# Patient Record
Sex: Female | Born: 1970 | Race: White | Hispanic: Yes | Marital: Single
Health system: Southern US, Community
[De-identification: ages and names within clinical notes are randomized; demographics above are authoritative.]

## PROBLEM LIST (undated history)

## (undated) DIAGNOSIS — IMO0002 Reserved for concepts with insufficient information to code with codable children: Secondary | ICD-10-CM

## (undated) DIAGNOSIS — K219 Gastro-esophageal reflux disease without esophagitis: Secondary | ICD-10-CM

## (undated) DIAGNOSIS — IMO0001 Reserved for inherently not codable concepts without codable children: Secondary | ICD-10-CM

## (undated) DIAGNOSIS — F32A Depression, unspecified: Secondary | ICD-10-CM

## (undated) DIAGNOSIS — R011 Cardiac murmur, unspecified: Secondary | ICD-10-CM

## (undated) DIAGNOSIS — F329 Major depressive disorder, single episode, unspecified: Secondary | ICD-10-CM

## (undated) HISTORY — PX: CERVICAL SPINE SURGERY: SHX589

## (undated) HISTORY — PX: TUBAL LIGATION: SHX77

## (undated) HISTORY — PX: ABDOMINAL HYSTERECTOMY: SHX81

## (undated) HISTORY — PX: ECTOPIC PREGNANCY SURGERY: SHX613

## (undated) HISTORY — PX: ANKLE SURGERY: SHX546

---

## 2004-12-22 ENCOUNTER — Emergency Department (HOSPITAL_COMMUNITY): Admission: EM | Admit: 2004-12-22 | Discharge: 2004-12-23 | Payer: Self-pay | Admitting: Emergency Medicine

## 2008-10-22 ENCOUNTER — Emergency Department (HOSPITAL_COMMUNITY): Admission: EM | Admit: 2008-10-22 | Discharge: 2008-10-22 | Payer: Self-pay | Admitting: Emergency Medicine

## 2011-09-27 ENCOUNTER — Encounter (HOSPITAL_BASED_OUTPATIENT_CLINIC_OR_DEPARTMENT_OTHER): Payer: Self-pay | Admitting: *Deleted

## 2011-09-27 ENCOUNTER — Emergency Department (HOSPITAL_BASED_OUTPATIENT_CLINIC_OR_DEPARTMENT_OTHER)
Admission: EM | Admit: 2011-09-27 | Discharge: 2011-09-27 | Disposition: A | Discharge status: Home / Self Care | Payer: Medicare Other | Attending: Emergency Medicine | Admitting: Emergency Medicine

## 2011-09-27 ENCOUNTER — Emergency Department (HOSPITAL_BASED_OUTPATIENT_CLINIC_OR_DEPARTMENT_OTHER): Payer: Medicare Other

## 2011-09-27 DIAGNOSIS — M25519 Pain in unspecified shoulder: Secondary | ICD-10-CM | POA: Insufficient documentation

## 2011-09-27 DIAGNOSIS — M549 Dorsalgia, unspecified: Secondary | ICD-10-CM | POA: Insufficient documentation

## 2011-09-27 DIAGNOSIS — F17200 Nicotine dependence, unspecified, uncomplicated: Secondary | ICD-10-CM | POA: Insufficient documentation

## 2011-09-27 HISTORY — DX: Reserved for inherently not codable concepts without codable children: IMO0001

## 2011-09-27 HISTORY — DX: Depression, unspecified: F32.A

## 2011-09-27 HISTORY — DX: Major depressive disorder, single episode, unspecified: F32.9

## 2011-09-27 HISTORY — DX: Gastro-esophageal reflux disease without esophagitis: K21.9

## 2011-09-27 HISTORY — DX: Reserved for concepts with insufficient information to code with codable children: IMO0002

## 2011-09-27 HISTORY — DX: Cardiac murmur, unspecified: R01.1

## 2011-09-27 MED ORDER — DICLOFENAC SODIUM 75 MG PO TBEC: 75.0000 mg | DELAYED_RELEASE_TABLET | Freq: Two times a day (BID) | ORAL | Status: AC

## 2011-09-27 MED ORDER — HYDROCODONE-ACETAMINOPHEN 5-325 MG PO TABS: 2.0000 | ORAL_TABLET | ORAL | Status: AC | PRN

## 2011-09-27 MED ORDER — HYDROCODONE-ACETAMINOPHEN 5-325 MG PO TABS
2.0000 | ORAL_TABLET | Freq: Once | ORAL | Status: AC
  Administered 2011-09-27: 2 via ORAL
  Filled 2011-09-27: qty 2

## 2011-09-27 MED ORDER — METHOCARBAMOL 500 MG PO TABS: 500.0000 mg | ORAL_TABLET | Freq: Two times a day (BID) | ORAL | Status: AC

## 2011-09-27 MED ORDER — KETOROLAC TROMETHAMINE 60 MG/2ML IM SOLN
60.0000 mg | Freq: Once | INTRAMUSCULAR | Status: AC
  Administered 2011-09-27: 60 mg via INTRAMUSCULAR
  Filled 2011-09-27: qty 2

## 2011-09-27 NOTE — ED Provider Notes (Signed)
Medical screening examination/treatment/procedure(s) were performed by non-physician practitioner and as supervising physician I was immediately available for consultation/collaboration.    Jenese Mischke L Corrigan Kretschmer, MD 09/27/11 1838 

## 2011-09-27 NOTE — Discharge Instructions (Signed)
Back Pain, Adult  Low back pain is very common. About 1 in 5 people have back pain. The cause of low back pain is rarely dangerous. The pain often gets better over time. About half of people with a sudden onset of back pain feel better in just 2 weeks. About 8 in 10 people feel better by 6 weeks.    CAUSES  Some common causes of back pain include:   Strain of the muscles or ligaments supporting the spine.    Wear and tear (degeneration) of the spinal discs.    Arthritis.    Direct injury to the back.   DIAGNOSIS  Most of the time, the direct cause of low back pain is not known. However, back pain can be treated effectively even when the exact cause of the pain is unknown. Answering your caregiver's questions about your overall health and symptoms is one of the most accurate ways to make sure the cause of your pain is not dangerous. If your caregiver needs more information, he or she may order lab work or imaging tests (X-rays or MRIs). However, even if imaging tests show changes in your back, this usually does not require surgery.  HOME CARE INSTRUCTIONS  For many people, back pain returns. Since low back pain is rarely dangerous, it is often a condition that people can learn to manage on their own.     Remain active. It is stressful on the back to sit or stand in one place. Do not sit, drive, or stand in one place for more than 30 minutes at a time. Take short walks on level surfaces as soon as pain allows. Try to increase the length of time you walk each day.    Do not stay in bed. Resting more than 1 or 2 days can delay your recovery.    Do not avoid exercise or work. Your body is made to move. It is not dangerous to be active, even though your back may hurt. Your back will likely heal faster if you return to being active before your pain is gone.     Pay attention to your body when you  bend and lift. Many people have less discomfort when lifting if they bend their knees, keep the load close to their bodies, and avoid twisting. Often, the most comfortable positions are those that put less stress on your recovering back.    Find a comfortable position to sleep. Use a firm mattress and lie on your side with your knees slightly bent. If you lie on your back, put a pillow under your knees.    Only take over-the-counter or prescription medicines as directed by your caregiver. Over-the-counter medicines to reduce pain and inflammation are often the most helpful. Your caregiver may prescribe muscle relaxant drugs. These medicines help dull your pain so you can more quickly return to your normal activities and healthy exercise.    Put ice on the injured area.    Put ice in a plastic bag.    Place a towel between your skin and the bag.    Leave the ice on for 15 to 20 minutes, 3 to 4 times a day for the first 2 to 3 days. After that, ice and heat may be alternated to reduce pain and spasms.    Ask your caregiver about trying back exercises and gentle massage. This may be of some benefit.    Avoid feeling anxious or stressed. Stress increases muscle tension and can worsen   back pain. It is important to recognize when you are anxious or stressed and learn ways to manage it. Exercise is a great option.   SEEK MEDICAL CARE IF:   You have pain that is not relieved with rest or medicine.    You have pain that does not improve in 1 week.    You have new symptoms.    You are generally not feeling well.   SEEK IMMEDIATE MEDICAL CARE IF:     You have pain that radiates from your back into your legs.    You develop new bowel or bladder control problems.    You have unusual weakness or numbness in your arms or legs.    You develop nausea or vomiting.    You develop abdominal pain.    You feel faint.    Document Released: 04/19/2005 Document Revised: 04/08/2011 Document Reviewed: 09/07/2010  [employer] Patient Information 2012 [employer]    Back Pain, Adult  Low back pain is very common. About 1 in 5 people have back pain. The cause of low back pain is rarely dangerous. The pain often gets better over time. About half of people with a sudden onset of back pain feel better in just 2 weeks. About 8 in 10 people feel better by 6 weeks.    CAUSES  Some common causes of back pain include:   Strain of the muscles or ligaments supporting the spine.    Wear and tear (degeneration) of the spinal discs.    Arthritis.    Direct injury to the back.   DIAGNOSIS  Most of the time, the direct cause of low back pain is not known. However, back pain can be treated effectively even when the exact cause of the pain is unknown. Answering your caregiver's questions about your overall health and symptoms is one of the most accurate ways to make sure the cause of your pain is not dangerous. If your caregiver needs more information, he or she may order lab work or imaging tests (X-rays or MRIs). However, even if imaging tests show changes in your back, this usually does not require surgery.  HOME CARE INSTRUCTIONS  For many people, back pain returns. Since low back pain is rarely dangerous, it is often a condition that people can learn to manage on their own.     Remain active. It is stressful on the back to sit or stand in one place. Do not sit, drive, or stand in one place for more than 30 minutes at a time. Take short walks on level surfaces as soon as pain allows. Try to increase the length of time you walk each day.    Do not stay in bed. Resting more than 1 or 2 days can delay your recovery.    Do not avoid exercise or work. Your body is made to move. It is not dangerous to be active, even though your back may hurt. Your back will likely heal faster if you return to being active before your pain is gone.     Pay attention to your body when you  bend and lift. Many people have less discomfort when lifting if they bend their knees, keep the load close to their bodies, and avoid twisting. Often, the most comfortable positions are those that put less stress on your recovering back.    Find a comfortable position to sleep. Use a firm mattress and lie on your side with your knees slightly bent. If you lie on   your back, put a pillow under your knees.    Only take over-the-counter or prescription medicines as directed by your caregiver. Over-the-counter medicines to reduce pain and inflammation are often the most helpful. Your caregiver may prescribe muscle relaxant drugs. These medicines help dull your pain so you can more quickly return to your normal activities and healthy exercise.    Put ice on the injured area.    Put ice in a plastic bag.    Place a towel between your skin and the bag.    Leave the ice on for 15 to 20 minutes, 3 to 4 times a day for the first 2 to 3 days. After that, ice and heat may be alternated to reduce pain and spasms.    Ask your caregiver about trying back exercises and gentle massage. This may be of some benefit.    Avoid feeling anxious or stressed. Stress increases muscle tension and can worsen back pain. It is important to recognize when you are anxious or stressed and learn ways to manage it. Exercise is a great option.   SEEK MEDICAL CARE IF:   You have pain that is not relieved with rest or medicine.    You have pain that does not improve in 1 week.    You have new symptoms.    You are generally not feeling well.   SEEK IMMEDIATE MEDICAL CARE IF:     You have pain that radiates from your back into your legs.    You develop new bowel or bladder control problems.    You have unusual weakness or numbness in your arms or legs.    You develop nausea or vomiting.    You develop abdominal pain.    You feel faint.    Document Released: 04/19/2005 Document Revised: 04/08/2011 Document Reviewed: 09/07/2010  [employer] Patient Information 2012 [employer].

## 2011-09-27 NOTE — ED Provider Notes (Signed)
History     CSN: [id number]  Arrival date & time 09/27/11  1258   First MD Initiated Contact with Patient 09/27/11 1457      Chief Complaint  Patient presents with  . Shoulder Pain    posterior shoulder blade    (Consider location/radiation/quality/duration/timing/severity/associated sxs/prior treatment) Patient is a 41 y.o. female presenting with back pain. The history is provided by the patient. No language interpreter was used.  Back Pain  This is a new problem. The problem occurs constantly. The problem has been gradually worsening. The pain is associated with no known injury. The pain is present in the thoracic spine. The quality of the pain is described as stabbing. The symptoms are aggravated by certain positions. The pain is worse during the night. She has tried NSAIDs and analgesics for the symptoms. The treatment provided no relief. Risk factors: hx of neck problems.    Past Medical History  Diagnosis Date  . Murmur   . Reflux   . Depression   . Degenerative disk disease     Past Surgical History  Procedure Date  . Cervical spine surgery   . Ankle surgery   . Tubal ligation   . Ectopic pregnancy surgery   . Abdominal hysterectomy     No family history on file.  History  Substance Use Topics  . Smoking status: Current Everyday Smoker -- 0.5 packs/day    Types: Cigarettes  . Smokeless tobacco: Not on file  . Alcohol Use: No    OB History    Grav Para Term Preterm Abortions TAB SAB Ect Mult Living                  Review of Systems  Musculoskeletal: Positive for back pain.  All other systems reviewed and are negative.    Allergies  Review of patient's allergies indicates no known allergies.  Home Medications  No current outpatient prescriptions on file.  BP 127/86  Pulse 89  Temp(Src) 98.4 F (36.9 C) (Oral)  Resp 20  Ht 5\' 6"  (1.676 m)  Wt 168 lb (76.204 kg)  BMI 27.12 kg/m2  SpO2 100%  Physical Exam  Nursing note and vitals  reviewed. Constitutional: She is oriented to person, place, and time. She appears well-developed and well-nourished.  HENT:  Head: Normocephalic and atraumatic.  Right Ear: External ear normal.  Left Ear: External ear normal.  Mouth/Throat: Oropharynx is clear and moist.  Eyes: Conjunctivae are normal. Pupils are equal, round, and reactive to light.  Neck: Normal range of motion. Neck supple.  Abdominal: Soft.  Musculoskeletal: Normal range of motion.  Neurological: She is alert and oriented to person, place, and time. She has normal reflexes.  Skin: Skin is warm.  Psychiatric: She has a normal mood and affect.    ED Course  Procedures (including critical care time)  Labs Reviewed - No data to display No results found.   No diagnosis found.  No results found for this or any previous visit. Dg Chest 2 View  09/27/2011  *RADIOLOGY REPORT*  Clinical Data: Back pain  CHEST - 2 VIEW  Comparison: None.  Findings: Upper normal heart size.  Bronchitic changes.  Bibasilar haziness.  No pneumothorax or pleural effusion.  IMPRESSION: Bronchitic changes.  Original Report Authenticated By: Donavan Burnet, M.D.     MDM  Chest xray normal.   Pt given torodol and hydrocodone here.  Pt referred to Dr. Pearletha Forge  For evaluation.   I think pain is muscle  spasm.        Lonia Skinner [place], [place] 09/27/11 [phone]

## 2011-09-27 NOTE — ED Notes (Signed)
Patient states she has had left shoulder blade pain for the last 3 days.  Pain is increased with deep breathing.  Hx of neck surgery in the past.  Has taken percocet with no relief.

## 2011-10-05 ENCOUNTER — Ambulatory Visit: Payer: Medicare Other | Admitting: Family Medicine

## 2011-10-06 ENCOUNTER — Ambulatory Visit: Payer: Medicare Other | Admitting: Family Medicine

## 2012-04-27 ENCOUNTER — Emergency Department (HOSPITAL_BASED_OUTPATIENT_CLINIC_OR_DEPARTMENT_OTHER)
Admission: EM | Admit: 2012-04-27 | Discharge: 2012-04-27 | Disposition: A | Discharge status: Home / Self Care | Payer: Medicare Other | Attending: Emergency Medicine | Admitting: Emergency Medicine

## 2012-04-27 ENCOUNTER — Encounter (HOSPITAL_BASED_OUTPATIENT_CLINIC_OR_DEPARTMENT_OTHER): Payer: Self-pay | Admitting: Student

## 2012-04-27 DIAGNOSIS — G8929 Other chronic pain: Secondary | ICD-10-CM | POA: Insufficient documentation

## 2012-04-27 DIAGNOSIS — Z90710 Acquired absence of both cervix and uterus: Secondary | ICD-10-CM | POA: Insufficient documentation

## 2012-04-27 DIAGNOSIS — R011 Cardiac murmur, unspecified: Secondary | ICD-10-CM | POA: Insufficient documentation

## 2012-04-27 DIAGNOSIS — M79609 Pain in unspecified limb: Secondary | ICD-10-CM | POA: Insufficient documentation

## 2012-04-27 DIAGNOSIS — Z8719 Personal history of other diseases of the digestive system: Secondary | ICD-10-CM | POA: Insufficient documentation

## 2012-04-27 DIAGNOSIS — Z8739 Personal history of other diseases of the musculoskeletal system and connective tissue: Secondary | ICD-10-CM | POA: Insufficient documentation

## 2012-04-27 DIAGNOSIS — R5381 Other malaise: Secondary | ICD-10-CM | POA: Insufficient documentation

## 2012-04-27 DIAGNOSIS — F17200 Nicotine dependence, unspecified, uncomplicated: Secondary | ICD-10-CM | POA: Insufficient documentation

## 2012-04-27 DIAGNOSIS — Z8659 Personal history of other mental and behavioral disorders: Secondary | ICD-10-CM | POA: Insufficient documentation

## 2012-04-27 DIAGNOSIS — M25519 Pain in unspecified shoulder: Secondary | ICD-10-CM | POA: Insufficient documentation

## 2012-04-27 DIAGNOSIS — R5383 Other fatigue: Secondary | ICD-10-CM | POA: Insufficient documentation

## 2012-04-27 DIAGNOSIS — Z3202 Encounter for pregnancy test, result negative: Secondary | ICD-10-CM | POA: Insufficient documentation

## 2012-04-27 DIAGNOSIS — Z9889 Other specified postprocedural states: Secondary | ICD-10-CM | POA: Insufficient documentation

## 2012-04-27 DIAGNOSIS — Z9851 Tubal ligation status: Secondary | ICD-10-CM | POA: Insufficient documentation

## 2012-04-27 LAB — URINALYSIS, ROUTINE W REFLEX MICROSCOPIC
Bilirubin Urine: NEGATIVE
Glucose, UA: NEGATIVE mg/dL
Hgb urine dipstick: NEGATIVE
Ketones, ur: NEGATIVE mg/dL
Leukocytes, UA: NEGATIVE
Nitrite: NEGATIVE
Protein, ur: NEGATIVE mg/dL
Specific Gravity, Urine: 1.008 (ref 1.005–1.030)
Urobilinogen, UA: 0.2 mg/dL (ref 0.0–1.0)
pH: 6.5 (ref 5.0–8.0)

## 2012-04-27 LAB — PREGNANCY, URINE: Preg Test, Ur: NEGATIVE

## 2012-04-27 MED ORDER — OXYCODONE-ACETAMINOPHEN 5-325 MG PO TABS: 1.0000 | ORAL_TABLET | Freq: Four times a day (QID) | ORAL | Status: AC | PRN

## 2012-04-27 MED ORDER — IBUPROFEN 800 MG PO TABS: 800.0000 mg | ORAL_TABLET | Freq: Three times a day (TID) | ORAL | Status: AC

## 2012-04-27 MED ORDER — CYCLOBENZAPRINE HCL 10 MG PO TABS: 10.0000 mg | ORAL_TABLET | Freq: Two times a day (BID) | ORAL | Status: AC | PRN

## 2012-04-27 NOTE — ED Notes (Signed)
MD at bedside. 

## 2012-04-27 NOTE — ED Notes (Signed)
Right shoulder pain. Pt reports prior HX of shoulder pain and surgery in past

## 2012-04-27 NOTE — ED Provider Notes (Signed)
History     CSN: [id number]  Arrival date & time 04/27/12  1331   First MD Initiated Contact with Patient 04/27/12 [phone]      Chief Complaint  Patient presents with  . Shoulder Pain    right shoulder pain    (Consider location/radiation/quality/duration/timing/severity/associated sxs/prior treatment) HPI Comments: [name] presents ambulatory for evaluation of right shoulder pain.  She had a surgery on her neck several years ago and has had weakness and pain in her right shoulder and arm since.  She stopped attending rehab about a year ago and has noted a slow progressive decline in function and an increase in pain.  Patient is a 41 y.o. female presenting with shoulder pain. The history is provided by the patient.  Shoulder Pain This is a chronic problem. The current episode started more than 1 week ago. The problem occurs constantly. The problem has been gradually worsening. Pertinent negatives include no chest pain, no abdominal pain, no headaches and no shortness of breath. Exacerbated by: lifting the arm. The symptoms are relieved by rest. She has tried nothing for the symptoms.    Past Medical History  Diagnosis Date  . Murmur   . Reflux   . Depression   . Degenerative disk disease     Past Surgical History  Procedure Date  . Cervical spine surgery   . Ankle surgery   . Tubal ligation   . Ectopic pregnancy surgery   . Abdominal hysterectomy     History reviewed. No pertinent family history.  History  Substance Use Topics  . Smoking status: Current Every Day Smoker -- 0.5 packs/day    Types: Cigarettes  . Smokeless tobacco: Not on file  . Alcohol Use: No    OB History    Grav Para Term Preterm Abortions TAB SAB Ect Mult Living                  Review of Systems  Respiratory: Negative for shortness of breath.   Cardiovascular: Negative for chest pain.  Gastrointestinal: Negative for abdominal pain.  Neurological: Negative for headaches.    All other systems reviewed and are negative.    Allergies  Review of patient's allergies indicates no known allergies.  Home Medications   Current Outpatient Rx  Name  Route  Sig  Dispense  Refill  . CYCLOBENZAPRINE HCL 10 MG PO TABS   Oral   Take 10 mg by mouth 3 (three) times daily as needed. Patient used this medication for pain.         Marland Kitchen DICLOFENAC SODIUM 75 MG PO TBEC   Oral   Take 1 tablet (75 mg total) by mouth 2 (two) times daily.   20 tablet   0   . HYDROCODONE-ACETAMINOPHEN 10-500 MG PO TABS   Oral   Take 1 tablet by mouth every 6 (six) hours as needed. Patient used this medication for pain.         . OXYCODONE-ACETAMINOPHEN 10-325 MG PO TABS   Oral   Take 1 tablet by mouth every 4 (four) hours as needed. Patient used this medication for pain.           BP 130/77  Pulse 102  Temp 98.4 F (36.9 C) (Oral)  Resp 18  Ht 5\' 5"  (1.651 m)  Wt 162 lb (73.483 kg)  BMI 26.96 kg/m2  SpO2 100%  Physical Exam  Nursing note and vitals reviewed. Constitutional: She is oriented to person, place, and time. She appears  well-developed and well-nourished. No distress.  HENT:  Head: Normocephalic and atraumatic.  Right Ear: External ear normal.  Left Ear: External ear normal.  Nose: Nose normal.  Mouth/Throat: Oropharynx is clear and moist. No oropharyngeal exudate.  Eyes: Conjunctivae normal are normal. Pupils are equal, round, and reactive to light. Right eye exhibits no discharge. Left eye exhibits no discharge. No scleral icterus.  Neck: Normal range of motion. Neck supple. No JVD present. No tracheal deviation present.  Cardiovascular: Normal rate, regular rhythm, normal heart sounds and intact distal pulses.  Exam reveals no gallop and no friction rub.   No murmur heard. Pulmonary/Chest: Effort normal and breath sounds normal. No stridor. No respiratory distress. She has no wheezes. She has no rales. She exhibits no tenderness.  Abdominal: Soft. Bowel sounds  are normal. She exhibits no distension and no mass. There is no tenderness. There is no rebound and no guarding.  Musculoskeletal: Normal range of motion. She exhibits tenderness. She exhibits no edema.       Right shoulder: She exhibits tenderness, pain, spasm and decreased strength. She exhibits normal range of motion, no bony tenderness, no swelling, no effusion, no crepitus, no deformity, no laceration and normal pulse.  Lymphadenopathy:    She has no cervical adenopathy.  Neurological: She is alert and oriented to person, place, and time. She displays no atrophy and no tremor. No cranial nerve deficit or sensory deficit. She exhibits abnormal muscle tone. She displays no seizure activity. Gait normal. GCS eye subscore is 4. GCS verbal subscore is 5. GCS motor subscore is 6.  Reflex Scores:      Tricep reflexes are 1+ on the right side and 1+ on the left side.      Bicep reflexes are 1+ on the right side and 1+ on the left side. Skin: Skin is warm and dry. No rash noted. She is not diaphoretic. No erythema. No pallor.  Psychiatric: She has a normal mood and affect. Her behavior is normal.    ED Course  Procedures (including critical care time)   Labs Reviewed  URINALYSIS, ROUTINE W REFLEX MICROSCOPIC  PREGNANCY, URINE   No results found.   No diagnosis found.    MDM  Pt presents for evaluation of recurrent shoulder pain.  She appears nontoxic, note stable VS, NAD.  Pt has some weakness in the grip strength of her right hand abut intact DTRs.  She states this deficit is not new.  I encouraged her to re-establish care with an orthopedic and rehab specialist.  Will prescribe a short course of an antiinflammatory, a muscle relaxer, and a narcotic for breakthrough pain.  She states she will follow up as suggested.        Crystal Chad, MD 04/27/12 1538

## 2012-05-28 ENCOUNTER — Encounter (HOSPITAL_BASED_OUTPATIENT_CLINIC_OR_DEPARTMENT_OTHER): Payer: Self-pay | Admitting: Emergency Medicine

## 2012-05-28 ENCOUNTER — Emergency Department (HOSPITAL_BASED_OUTPATIENT_CLINIC_OR_DEPARTMENT_OTHER): Payer: Medicare Other

## 2012-05-28 ENCOUNTER — Emergency Department (HOSPITAL_BASED_OUTPATIENT_CLINIC_OR_DEPARTMENT_OTHER)
Admission: EM | Admit: 2012-05-28 | Discharge: 2012-05-28 | Disposition: A | Discharge status: Home / Self Care | Payer: Medicare Other | Attending: Emergency Medicine | Admitting: Emergency Medicine

## 2012-05-28 DIAGNOSIS — F3289 Other specified depressive episodes: Secondary | ICD-10-CM | POA: Insufficient documentation

## 2012-05-28 DIAGNOSIS — S0993XA Unspecified injury of face, initial encounter: Secondary | ICD-10-CM | POA: Insufficient documentation

## 2012-05-28 DIAGNOSIS — Z8739 Personal history of other diseases of the musculoskeletal system and connective tissue: Secondary | ICD-10-CM | POA: Insufficient documentation

## 2012-05-28 DIAGNOSIS — R011 Cardiac murmur, unspecified: Secondary | ICD-10-CM | POA: Insufficient documentation

## 2012-05-28 DIAGNOSIS — Z79899 Other long term (current) drug therapy: Secondary | ICD-10-CM | POA: Insufficient documentation

## 2012-05-28 DIAGNOSIS — Y92410 Unspecified street and highway as the place of occurrence of the external cause: Secondary | ICD-10-CM | POA: Insufficient documentation

## 2012-05-28 DIAGNOSIS — F329 Major depressive disorder, single episode, unspecified: Secondary | ICD-10-CM | POA: Insufficient documentation

## 2012-05-28 DIAGNOSIS — K219 Gastro-esophageal reflux disease without esophagitis: Secondary | ICD-10-CM | POA: Insufficient documentation

## 2012-05-28 DIAGNOSIS — Y9389 Activity, other specified: Secondary | ICD-10-CM | POA: Insufficient documentation

## 2012-05-28 DIAGNOSIS — F17200 Nicotine dependence, unspecified, uncomplicated: Secondary | ICD-10-CM | POA: Insufficient documentation

## 2012-05-28 MED ORDER — HYDROCODONE-ACETAMINOPHEN 5-325 MG PO TABS: 1.0000 | ORAL_TABLET | Freq: Four times a day (QID) | ORAL | Status: AC | PRN

## 2012-05-28 MED ORDER — NAPROXEN 375 MG PO TABS: 375.0000 mg | ORAL_TABLET | Freq: Two times a day (BID) | ORAL | Status: DC

## 2012-05-28 MED ORDER — IBUPROFEN 800 MG PO TABS
800.0000 mg | ORAL_TABLET | Freq: Once | ORAL | Status: AC
  Administered 2012-05-28: 800 mg via ORAL
  Filled 2012-05-28: qty 1

## 2012-05-28 MED ORDER — OXYCODONE-ACETAMINOPHEN 5-325 MG PO TABS
2.0000 | ORAL_TABLET | Freq: Once | ORAL | Status: AC
  Administered 2012-05-28: 2 via ORAL
  Filled 2012-05-28 (×2): qty 2

## 2012-05-28 MED ORDER — CYCLOBENZAPRINE HCL 10 MG PO TABS
5.0000 mg | ORAL_TABLET | Freq: Once | ORAL | Status: AC
  Administered 2012-05-28: 5 mg via ORAL
  Filled 2012-05-28: qty 1

## 2012-05-28 MED ORDER — CYCLOBENZAPRINE HCL 10 MG PO TABS: 10.0000 mg | ORAL_TABLET | Freq: Three times a day (TID) | ORAL | Status: AC | PRN
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
## 2012-05-29 NOTE — ED Provider Notes (Signed)
History/physical exam/procedure(s) were performed by non-physician practitioner and as supervising physician I was immediately available for consultation/collaboration. I have reviewed all notes and am in agreement with care and plan.   Dakoda Laventure S Rollin Kotowski, MD 05/29/12 0700 

## 2013-01-04 ENCOUNTER — Emergency Department (HOSPITAL_BASED_OUTPATIENT_CLINIC_OR_DEPARTMENT_OTHER)
Admission: EM | Admit: 2013-01-04 | Discharge: 2013-01-04 | Disposition: A | Discharge status: Home / Self Care | Payer: Medicare Other | Attending: Emergency Medicine | Admitting: Emergency Medicine

## 2013-01-04 ENCOUNTER — Encounter (HOSPITAL_BASED_OUTPATIENT_CLINIC_OR_DEPARTMENT_OTHER): Payer: Self-pay | Admitting: *Deleted

## 2013-01-04 ENCOUNTER — Emergency Department (HOSPITAL_BASED_OUTPATIENT_CLINIC_OR_DEPARTMENT_OTHER): Payer: Medicare Other

## 2013-01-04 DIAGNOSIS — M79604 Pain in right leg: Secondary | ICD-10-CM

## 2013-01-04 DIAGNOSIS — R52 Pain, unspecified: Secondary | ICD-10-CM | POA: Insufficient documentation

## 2013-01-04 DIAGNOSIS — R011 Cardiac murmur, unspecified: Secondary | ICD-10-CM | POA: Insufficient documentation

## 2013-01-04 DIAGNOSIS — Z8659 Personal history of other mental and behavioral disorders: Secondary | ICD-10-CM | POA: Insufficient documentation

## 2013-01-04 DIAGNOSIS — R609 Edema, unspecified: Secondary | ICD-10-CM | POA: Insufficient documentation

## 2013-01-04 DIAGNOSIS — M79609 Pain in unspecified limb: Secondary | ICD-10-CM | POA: Insufficient documentation

## 2013-01-04 DIAGNOSIS — Z8719 Personal history of other diseases of the digestive system: Secondary | ICD-10-CM | POA: Insufficient documentation

## 2013-01-04 DIAGNOSIS — Z8739 Personal history of other diseases of the musculoskeletal system and connective tissue: Secondary | ICD-10-CM | POA: Insufficient documentation

## 2013-01-04 DIAGNOSIS — Z9889 Other specified postprocedural states: Secondary | ICD-10-CM | POA: Insufficient documentation

## 2013-01-04 DIAGNOSIS — F17200 Nicotine dependence, unspecified, uncomplicated: Secondary | ICD-10-CM | POA: Insufficient documentation

## 2013-01-04 MED ORDER — HYDROCODONE-ACETAMINOPHEN 5-325 MG PO TABS
1.0000 | ORAL_TABLET | Freq: Once | ORAL | Status: AC
  Administered 2013-01-04: 1 via ORAL
  Filled 2013-01-04: qty 1

## 2013-01-04 NOTE — ED Notes (Signed)
Pt amb to room 3 with slow, steady gait . Pt reports right foot and ankle swelling x yesterday. Pt has negative Homan's sign, left foot is swollen to top of foot, pt states she had surgery on this foot as a child.

## 2013-01-04 NOTE — ED Provider Notes (Signed)
CSN: [id number]     Arrival date & time 01/04/13  1223 History   First MD Initiated Contact with Patient 01/04/13 1240     Chief Complaint  Patient presents with  . Leg Pain   (Consider location/radiation/quality/duration/timing/severity/associated sxs/prior Treatment) HPI Comments: Pt comes in with acute right leg pain that started last night. The pain is sharp, constant, and there is new swelling. No trauma. No hx of DVT, and no risk factors for the same. Pt had a foot surgery when she was a child. No claudication like pain, no n/v/f/c.  Patient is a 42 y.o. female presenting with leg pain. The history is provided by the patient.  Leg Pain Associated symptoms: no neck pain     Past Medical History  Diagnosis Date  . Murmur   . Reflux   . Depression   . Degenerative disk disease    Past Surgical History  Procedure Laterality Date  . Cervical spine surgery    . Ankle surgery    . Tubal ligation    . Ectopic pregnancy surgery    . Abdominal hysterectomy     History reviewed. No pertinent family history. History  Substance Use Topics  . Smoking status: Current Every Day Smoker -- 0.50 packs/day    Types: Cigarettes  . Smokeless tobacco: Not on file  . Alcohol Use: No   OB History   Grav Para Term Preterm Abortions TAB SAB Ect Mult Living                 Review of Systems  Constitutional: Negative for activity change.  HENT: Negative for neck pain.   Respiratory: Negative for shortness of breath.   Cardiovascular: Negative for chest pain.  Gastrointestinal: Negative for nausea, vomiting and abdominal pain.  Genitourinary: Negative for dysuria.  Musculoskeletal: Positive for myalgias.  Neurological: Negative for headaches.    Allergies  Review of patient's allergies indicates no known allergies.  Home Medications   Current Outpatient Rx  Name  Route  Sig  Dispense  Refill  . cyclobenzaprine (FLEXERIL) 10 MG tablet   Oral   Take 1 tablet (10 mg total) by mouth  2 (two) times daily as needed for muscle spasms.   20 tablet   0   . cyclobenzaprine (FLEXERIL) 10 MG tablet   Oral   Take 1 tablet (10 mg total) by mouth 3 (three) times daily as needed for muscle spasms.   30 tablet   0   . HYDROcodone-acetaminophen (NORCO/VICODIN) 5-325 MG per tablet   Oral   Take 1 tablet by mouth every 6 (six) hours as needed for pain.   15 tablet   0   . ibuprofen (ADVIL,MOTRIN) 800 MG tablet   Oral   Take 1 tablet (800 mg total) by mouth 3 (three) times daily.   21 tablet   0   . naproxen (NAPROSYN) 375 MG tablet   Oral   Take 1 tablet (375 mg total) by mouth 2 (two) times daily.   20 tablet   0   . oxyCODONE-acetaminophen (PERCOCET) 5-325 MG per tablet   Oral   Take 1 tablet by mouth every 6 (six) hours as needed for pain.   10 tablet   0    BP 114/62  Pulse 110  Temp(Src) 99.4 F (37.4 C) (Oral)  Resp 20  Ht 5\' 5"  (1.651 m)  Wt 168 lb (76.204 kg)  BMI 27.96 kg/m2  SpO2 100% Physical Exam  Nursing note and vitals  reviewed. Constitutional: She is oriented to person, place, and time. She appears well-developed and well-nourished.  HENT:  Head: Normocephalic and atraumatic.  Eyes: EOM are normal. Pupils are equal, round, and reactive to light.  Neck: Neck supple.  Cardiovascular: Normal rate, regular rhythm and normal heart sounds.   No murmur heard. Pulmonary/Chest: Effort normal. No respiratory distress.  Abdominal: Soft. She exhibits no distension. There is no tenderness. There is no rebound and no guarding.  Musculoskeletal: She exhibits edema and tenderness.  Pt has unilateral right sided swelling, tenderness to palpation of calf. No erythema, no callor. No deformity. 2+ DP.  Neurological: She is alert and oriented to person, place, and time.  Skin: Skin is warm and dry.    ED Course  Procedures (including critical care time) Labs Review Labs Reviewed - No data to display Imaging Review US Venous Img Lower Unilateral  Right  01/04/2013   *RADIOLOGY REPORT*  Clinical Data: Right leg swelling  RIGHT LOWER EXTREMITY VENOUS DUPLEX ULTRASOUND  Technique:  Gray-scale sonography with graded compression, as well as color Doppler and duplex ultrasound, were performed to evaluate the deep venous system of the lower extremity from the level of the common femoral vein through the popliteal and proximal calf veins. Spectral Doppler was utilized to evaluate flow at rest and with distal augmentation maneuvers.  Comparison:  None.  Findings: Spontaneous flow with phasicity and augmentation is identified throughout the right lower extremity.  No intraluminal filling defect to suggest deep venous thrombosis is noted. Adequate compressibility is noted.  IMPRESSION: No evidence of deep venous thrombosis.   Original Report Authenticated By: Alcide Clever, M.D.    MDM   1. Leg pain, right    Pt comes in with unilateral leg pain and swelling. Exam is concerning for DVT - US ordered, and is negative.  Our exam shows no signs of infection, trauma. Neurovascularly intact.  Will d/c - pt given med resources for outpatient f.u  Derwood Kaplan, MD 01/04/13 1537

## 2013-02-16 ENCOUNTER — Emergency Department (HOSPITAL_BASED_OUTPATIENT_CLINIC_OR_DEPARTMENT_OTHER): Payer: No Typology Code available for payment source

## 2013-02-16 ENCOUNTER — Emergency Department (HOSPITAL_BASED_OUTPATIENT_CLINIC_OR_DEPARTMENT_OTHER)
Admission: EM | Admit: 2013-02-16 | Discharge: 2013-02-16 | Disposition: A | Discharge status: Home / Self Care | Payer: No Typology Code available for payment source | Attending: Emergency Medicine | Admitting: Emergency Medicine

## 2013-02-16 ENCOUNTER — Encounter (HOSPITAL_BASED_OUTPATIENT_CLINIC_OR_DEPARTMENT_OTHER): Payer: Self-pay | Admitting: Emergency Medicine

## 2013-02-16 DIAGNOSIS — Z8659 Personal history of other mental and behavioral disorders: Secondary | ICD-10-CM | POA: Diagnosis not present

## 2013-02-16 DIAGNOSIS — S161XXA Strain of muscle, fascia and tendon at neck level, initial encounter: Secondary | ICD-10-CM

## 2013-02-16 DIAGNOSIS — S0993XA Unspecified injury of face, initial encounter: Secondary | ICD-10-CM | POA: Insufficient documentation

## 2013-02-16 DIAGNOSIS — R011 Cardiac murmur, unspecified: Secondary | ICD-10-CM | POA: Insufficient documentation

## 2013-02-16 DIAGNOSIS — Y92410 Unspecified street and highway as the place of occurrence of the external cause: Secondary | ICD-10-CM | POA: Insufficient documentation

## 2013-02-16 DIAGNOSIS — Z791 Long term (current) use of non-steroidal anti-inflammatories (NSAID): Secondary | ICD-10-CM | POA: Insufficient documentation

## 2013-02-16 DIAGNOSIS — Z8719 Personal history of other diseases of the digestive system: Secondary | ICD-10-CM | POA: Insufficient documentation

## 2013-02-16 DIAGNOSIS — S139XXA Sprain of joints and ligaments of unspecified parts of neck, initial encounter: Secondary | ICD-10-CM | POA: Insufficient documentation

## 2013-02-16 DIAGNOSIS — Z8739 Personal history of other diseases of the musculoskeletal system and connective tissue: Secondary | ICD-10-CM | POA: Diagnosis not present

## 2013-02-16 DIAGNOSIS — IMO0002 Reserved for concepts with insufficient information to code with codable children: Secondary | ICD-10-CM | POA: Insufficient documentation

## 2013-02-16 DIAGNOSIS — F17200 Nicotine dependence, unspecified, uncomplicated: Secondary | ICD-10-CM | POA: Diagnosis not present

## 2013-02-16 DIAGNOSIS — Y939 Activity, unspecified: Secondary | ICD-10-CM | POA: Diagnosis not present

## 2013-02-16 DIAGNOSIS — S39012A Strain of muscle, fascia and tendon of lower back, initial encounter: Secondary | ICD-10-CM

## 2013-02-16 MED ORDER — OXYCODONE-ACETAMINOPHEN 5-325 MG PO TABS
2.0000 | ORAL_TABLET | Freq: Once | ORAL | Status: AC
  Administered 2013-02-16: 2 via ORAL
  Filled 2013-02-16: qty 2

## 2013-02-16 MED ORDER — OXYCODONE-ACETAMINOPHEN 5-325 MG PO TABS: 2.0000 | ORAL_TABLET | ORAL | Status: AC | PRN

## 2013-02-16 MED ORDER — IBUPROFEN 800 MG PO TABS: 800.0000 mg | ORAL_TABLET | Freq: Three times a day (TID) | ORAL | Status: AC
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
## 2013-04-03 ENCOUNTER — Emergency Department (HOSPITAL_BASED_OUTPATIENT_CLINIC_OR_DEPARTMENT_OTHER): Payer: Medicare Other

## 2013-04-03 ENCOUNTER — Encounter (HOSPITAL_BASED_OUTPATIENT_CLINIC_OR_DEPARTMENT_OTHER): Payer: Self-pay | Admitting: Emergency Medicine

## 2013-04-03 ENCOUNTER — Emergency Department (HOSPITAL_BASED_OUTPATIENT_CLINIC_OR_DEPARTMENT_OTHER)
Admission: EM | Admit: 2013-04-03 | Discharge: 2013-04-04 | Disposition: A | Discharge status: Home / Self Care | Payer: Medicare Other | Attending: Emergency Medicine | Admitting: Emergency Medicine

## 2013-04-03 DIAGNOSIS — Y9289 Other specified places as the place of occurrence of the external cause: Secondary | ICD-10-CM | POA: Insufficient documentation

## 2013-04-03 DIAGNOSIS — W010XXA Fall on same level from slipping, tripping and stumbling without subsequent striking against object, initial encounter: Secondary | ICD-10-CM | POA: Insufficient documentation

## 2013-04-03 DIAGNOSIS — X500XXA Overexertion from strenuous movement or load, initial encounter: Secondary | ICD-10-CM | POA: Insufficient documentation

## 2013-04-03 DIAGNOSIS — R011 Cardiac murmur, unspecified: Secondary | ICD-10-CM | POA: Insufficient documentation

## 2013-04-03 DIAGNOSIS — R209 Unspecified disturbances of skin sensation: Secondary | ICD-10-CM | POA: Insufficient documentation

## 2013-04-03 DIAGNOSIS — S6390XA Sprain of unspecified part of unspecified wrist and hand, initial encounter: Secondary | ICD-10-CM | POA: Insufficient documentation

## 2013-04-03 DIAGNOSIS — Z79899 Other long term (current) drug therapy: Secondary | ICD-10-CM | POA: Insufficient documentation

## 2013-04-03 DIAGNOSIS — Z8719 Personal history of other diseases of the digestive system: Secondary | ICD-10-CM | POA: Insufficient documentation

## 2013-04-03 DIAGNOSIS — F17200 Nicotine dependence, unspecified, uncomplicated: Secondary | ICD-10-CM | POA: Insufficient documentation

## 2013-04-03 DIAGNOSIS — Z8659 Personal history of other mental and behavioral disorders: Secondary | ICD-10-CM | POA: Insufficient documentation

## 2013-04-03 DIAGNOSIS — IMO0002 Reserved for concepts with insufficient information to code with codable children: Secondary | ICD-10-CM | POA: Insufficient documentation

## 2013-04-03 DIAGNOSIS — Y9389 Activity, other specified: Secondary | ICD-10-CM | POA: Insufficient documentation

## 2013-04-03 DIAGNOSIS — S63619A Unspecified sprain of unspecified finger, initial encounter: Secondary | ICD-10-CM
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
## 2013-04-04 MED ORDER — OXYCODONE-ACETAMINOPHEN 5-325 MG PO TABS: 2.0000 | ORAL_TABLET | ORAL | Status: AC | PRN

## 2013-04-04 NOTE — ED Provider Notes (Signed)
CSN: [id number]     Arrival date & time 04/03/13  2339 History   First MD Initiated Contact with Patient 04/03/13 2355     Chief Complaint  Patient presents with  . Hand Injury   (Consider location/radiation/quality/duration/timing/severity/associated sxs/prior Treatment) HPI Comments: Patient presents with pain to her right fifth finger. She states that she was trying to open a car door and tripped on her boots, falling backward hyperextending the fifth finger of the right hand. This happened about 7:30 this evening. She's had ongoing pain and swelling to the area. She denies any other injuries. She took a muscle relaxer that she had a home which didn't improve the pain at first but now it's throbbing begins.   Patient is a 42 y.o. female presenting with hand injury.  Hand Injury Associated symptoms: no back pain, no fever and no neck pain     Past Medical History  Diagnosis Date  . Murmur   . Reflux   . Depression   . Degenerative disk disease    Past Surgical History  Procedure Laterality Date  . Cervical spine surgery    . Ankle surgery    . Tubal ligation    . Ectopic pregnancy surgery    . Abdominal hysterectomy     No family history on file. History  Substance Use Topics  . Smoking status: Current Every Day Smoker -- 0.50 packs/day    Types: Cigarettes  . Smokeless tobacco: Not on file  . Alcohol Use: No   OB History   Grav Para Term Preterm Abortions TAB SAB Ect Mult Living                 Review of Systems  Constitutional: Negative for fever.  Gastrointestinal: Negative for nausea and vomiting.  Musculoskeletal: Positive for arthralgias and joint swelling. Negative for back pain and neck pain.  Skin: Negative for rash and wound.  Neurological: Positive for numbness (entire finger feels numb). Negative for weakness and headaches.    Allergies  Review of patient's allergies indicates no known allergies.  Home Medications   Current Outpatient Rx  Name   Route  Sig  Dispense  Refill  . cyclobenzaprine (FLEXERIL) 10 MG tablet   Oral   Take 1 tablet (10 mg total) by mouth 2 (two) times daily as needed for muscle spasms.   20 tablet   0   . cyclobenzaprine (FLEXERIL) 10 MG tablet   Oral   Take 1 tablet (10 mg total) by mouth 3 (three) times daily as needed for muscle spasms.   30 tablet   0   . HYDROcodone-acetaminophen (NORCO/VICODIN) 5-325 MG per tablet   Oral   Take 1 tablet by mouth every 6 (six) hours as needed for pain.   15 tablet   0   . ibuprofen (ADVIL,MOTRIN) 800 MG tablet   Oral   Take 1 tablet (800 mg total) by mouth 3 (three) times daily.   21 tablet   0   . ibuprofen (ADVIL,MOTRIN) 800 MG tablet   Oral   Take 1 tablet (800 mg total) by mouth 3 (three) times daily.   21 tablet   0   . naproxen (NAPROSYN) 375 MG tablet   Oral   Take 1 tablet (375 mg total) by mouth 2 (two) times daily.   20 tablet   0   . oxyCODONE-acetaminophen (PERCOCET) 5-325 MG per tablet   Oral   Take 1 tablet by mouth every 6 (six) hours as  needed for pain.   10 tablet   0   . oxyCODONE-acetaminophen (PERCOCET) 5-325 MG per tablet   Oral   Take 2 tablets by mouth every 4 (four) hours as needed for pain.   20 tablet   0   . oxyCODONE-acetaminophen (PERCOCET) 5-325 MG per tablet   Oral   Take 2 tablets by mouth every 4 (four) hours as needed.   20 tablet   0    There were no vitals taken for this visit. Physical Exam  Constitutional: She is oriented to person, place, and time. She appears well-developed and well-nourished.  HENT:  Head: Normocephalic and atraumatic.  Neck: Normal range of motion. Neck supple.  Cardiovascular: Normal rate.   Pulmonary/Chest: Effort normal.  Musculoskeletal: She exhibits edema and tenderness.  There is diffuse swelling to the right fifth finger. She has tenderness primarily over the proximal aspect of the finger and over the fifth metacarpal. She is able to extend the finger. She's able to  flex the finger at the MCP joints but cannot do isolated flexion of the distal phalanx. Capillary refill is normal. She has some subjective decreased sensation to light touch in the entire finger. She has no pain to the wrist.  Neurological: She is alert and oriented to person, place, and time.  Skin: Skin is warm and dry.  Psychiatric: She has a normal mood and affect.    ED Course  Procedures (including critical care time) Labs Review Labs Reviewed - No data to display Imaging Review Dg Hand Complete Right  04/04/2013   EXAM: RIGHT HAND - COMPLETE 3+ VIEW  COMPARISON:  None.  FINDINGS: There is no evidence of fracture or dislocation. There is no evidence of arthropathy or other focal bone abnormality.  IMPRESSION: No acute osseous abnormality of the right hand.  Consider repeat radiograph in 7-10 days if clinical concern for nondisplaced fracture persists, to evaluate for interval change or callus formation.   Electronically Signed   By: Jearld Lesch M.D.   On: 04/04/2013 00:26    EKG Interpretation   None       MDM   1. Finger sprain, initial encounter    Patient presents with pain or sprain. There is no fractures identified. She does have limited flexion of the finger which would be concerning for a possible tendon rupture. She was placed in a finger splint. She was advised in ice and elevation. She was given prescription for Percocet for pain. She was also advised to use ibuprofen at home. She was given a referral to followup with Dr. Amanda Pea, who is the hand surgeon on call.  I advised to call in the morning for appointment.    Rolan Bucco, MD 04/04/13 [phone]

## 2017-02-12 ENCOUNTER — Encounter (HOSPITAL_BASED_OUTPATIENT_CLINIC_OR_DEPARTMENT_OTHER): Payer: Self-pay | Admitting: Emergency Medicine

## 2017-02-12 ENCOUNTER — Emergency Department (HOSPITAL_BASED_OUTPATIENT_CLINIC_OR_DEPARTMENT_OTHER)
Admission: EM | Admit: 2017-02-12 | Discharge: 2017-02-12 | Disposition: A | Discharge status: Home / Self Care | Payer: Medicare Other | Attending: Emergency Medicine | Admitting: Emergency Medicine

## 2017-02-12 ENCOUNTER — Emergency Department (HOSPITAL_BASED_OUTPATIENT_CLINIC_OR_DEPARTMENT_OTHER): Payer: Medicare Other

## 2017-02-12 DIAGNOSIS — M79671 Pain in right foot: Secondary | ICD-10-CM | POA: Insufficient documentation

## 2017-02-12 DIAGNOSIS — M25521 Pain in right elbow: Secondary | ICD-10-CM | POA: Insufficient documentation

## 2017-02-12 DIAGNOSIS — M25561 Pain in right knee: Secondary | ICD-10-CM

## 2017-02-12 DIAGNOSIS — M25562 Pain in left knee: Secondary | ICD-10-CM | POA: Diagnosis not present

## 2017-02-12 DIAGNOSIS — M25512 Pain in left shoulder: Secondary | ICD-10-CM | POA: Insufficient documentation

## 2017-02-12 DIAGNOSIS — M25522 Pain in left elbow: Secondary | ICD-10-CM | POA: Diagnosis not present

## 2017-02-12 DIAGNOSIS — Z79899 Other long term (current) drug therapy: Secondary | ICD-10-CM | POA: Diagnosis not present

## 2017-02-12 DIAGNOSIS — R600 Localized edema: Secondary | ICD-10-CM | POA: Insufficient documentation

## 2017-02-12 DIAGNOSIS — M25511 Pain in right shoulder: Secondary | ICD-10-CM | POA: Diagnosis not present

## 2017-02-12 MED ORDER — IBUPROFEN 400 MG PO TABS
600.0000 mg | ORAL_TABLET | Freq: Once | ORAL | Status: AC
  Administered 2017-02-12: 600 mg via ORAL
  Filled 2017-02-12: qty 1

## 2017-02-12 MED ORDER — NAPROXEN 500 MG PO TABS: 500.0000 mg | ORAL_TABLET | Freq: Two times a day (BID) | ORAL | 0 refills | Status: AC

## 2017-02-12 NOTE — ED Provider Notes (Signed)
[facility] Provider Note   CSN: [id number] Arrival date & time: 02/12/17  1338     History   Chief Complaint Chief Complaint  Patient presents with  . Knee Pain    HPI Crystal Holmes is a 46 y.o. female.  HPI  46 year old female presents with multiple areas of pain. Her primary, worst pains are in her right inferior knee and right heel. She had heel surgery when she was 46 years old. She's noticed swelling to her heel and has pain with ambulation. Right knee she feels like is a little swollen over the last 1 week. She also has pain in her bilateral shoulders, right worse than left, bilateral elbows, and bilateral knees. These have all been going on for at least one month. She has tried Vicodin which did not help. She denies any injuries. No fevers. No skin changes.  Past Medical History:  Diagnosis Date  . Degenerative disk disease   . Depression   . Murmur   . Reflux     There are no active problems to display for this patient.   Past Surgical History:  Procedure Laterality Date  . ABDOMINAL HYSTERECTOMY    . ANKLE SURGERY    . CERVICAL SPINE SURGERY    . ECTOPIC PREGNANCY SURGERY    . TUBAL LIGATION      OB History    No data available       Home Medications    Prior to Admission medications   Medication Sig Start Date End Date Taking? Authorizing Provider  cyclobenzaprine (FLEXERIL) 10 MG tablet Take 1 tablet (10 mg total) by mouth 2 (two) times daily as needed for muscle spasms. 04/27/12   Powers, Jules Husbands, MD  cyclobenzaprine (FLEXERIL) 10 MG tablet Take 1 tablet (10 mg total) by mouth 3 (three) times daily as needed for muscle spasms. 05/28/12   Paz, Earlie Raveling, PA-C  HYDROcodone-acetaminophen (NORCO/VICODIN) 5-325 MG per tablet Take 1 tablet by mouth every 6 (six) hours as needed for pain. 05/28/12   Paz, Earlie Raveling, PA-C  ibuprofen (ADVIL,MOTRIN) 800 MG tablet Take 1 tablet (800 mg total) by mouth 3 (three) times daily. 04/27/12   Powers,  Jules Husbands, MD  ibuprofen (ADVIL,MOTRIN) 800 MG tablet Take 1 tablet (800 mg total) by mouth 3 (three) times daily. 02/16/13   Rolan Bucco, MD  naproxen (NAPROSYN) 500 MG tablet Take 1 tablet (500 mg total) by mouth 2 (two) times daily with a meal. 02/12/17   Pricilla Loveless, MD  oxyCODONE-acetaminophen (PERCOCET) 5-325 MG per tablet Take 1 tablet by mouth every 6 (six) hours as needed for pain. 04/27/12   Powers, Jules Husbands, MD  oxyCODONE-acetaminophen (PERCOCET) 5-325 MG per tablet Take 2 tablets by mouth every 4 (four) hours as needed for pain. 02/16/13   Rolan Bucco, MD  oxyCODONE-acetaminophen (PERCOCET) 5-325 MG per tablet Take 2 tablets by mouth every 4 (four) hours as needed. 04/04/13   Rolan Bucco, MD    Family History History reviewed. No pertinent family history.  Social History Social History  Substance Use Topics  . Smoking status: Current Every Day Smoker    Packs/day: 0.50    Types: Cigarettes  . Smokeless tobacco: Never Used  . Alcohol use No     Allergies   Patient has no known allergies.   Review of Systems Review of Systems  Constitutional: Negative for fever.  Musculoskeletal: Positive for arthralgias and joint swelling.  Skin: Negative for color change.  Neurological: Negative for weakness and  numbness.  All other systems reviewed and are negative.    Physical Exam Updated Vital Signs BP 126/76 (BP Location: Left Arm)   Pulse 100   Temp 98.5 F (36.9 C) (Oral)   Resp 20   Ht  (1.651 m)   Wt 92.1 kg (203 lb)   SpO2 100%   BMI 33.78 kg/m   Physical Exam  Constitutional: She is oriented to person, place, and time. She appears well-developed and well-nourished. No distress.  HENT:  Head: Normocephalic and atraumatic.  Right Ear: External ear normal.  Left Ear: External ear normal.  Nose: Nose normal.  Eyes: Right eye exhibits no discharge. Left eye exhibits no discharge.  Cardiovascular: Normal rate, regular rhythm and normal heart  sounds.   Pulses:      Dorsalis pedis pulses are 2+ on the right side, and 2+ on the left side.  Pulmonary/Chest: Effort normal and breath sounds normal.  Abdominal: She exhibits no distension.  Musculoskeletal:       Right shoulder: She exhibits normal range of motion and no tenderness.       Left shoulder: She exhibits normal range of motion and no tenderness.       Right elbow: She exhibits normal range of motion and no swelling.       Left elbow: She exhibits normal range of motion and no swelling.       Right knee: She exhibits swelling (minimal). She exhibits normal range of motion, no effusion and no erythema. Tenderness found. Medial joint line and lateral joint line tenderness noted.       Left knee: She exhibits normal range of motion. No tenderness found.       Right foot: There is tenderness (with mild swelling over heel) and swelling.  Neurological: She is alert and oriented to person, place, and time.  Skin: Skin is warm and dry. She is not diaphoretic.  Nursing note and vitals reviewed.    ED Treatments / Results  Labs (all labs ordered are listed, but only abnormal results are displayed) Labs Reviewed - No data to display  EKG  EKG Interpretation None       Radiology Dg Knee Complete 4 Views Right  Result Date: 02/12/2017 CLINICAL DATA:  Knee pain, no trauma EXAM: RIGHT KNEE - COMPLETE 4+ VIEW COMPARISON:  None. FINDINGS: No fracture or dislocation is seen. The joint spaces are preserved. Visualized soft tissues are within normal limits. No suprapatellar knee joint effusion. IMPRESSION: Negative. Electronically Signed   By: Charline Bills M.D.   On: 02/12/2017 15:33   Dg Foot Complete Right  Result Date: 02/12/2017 CLINICAL DATA:  Right heel pain x2 months, no trauma EXAM: RIGHT FOOT COMPLETE - 3+ VIEW COMPARISON:  None. FINDINGS: No fracture or dislocation is seen. The joint spaces are preserved. Small plantar calcaneal enthesophyte. Visualized soft tissues  are within normal limits. IMPRESSION: Negative. Electronically Signed   By: Charline Bills M.D.   On: 02/12/2017 15:33    Procedures Procedures (including critical care time)  Medications Ordered in ED Medications  ibuprofen (ADVIL,MOTRIN) tablet 600 mg (600 mg Oral Given 02/12/17 1529)     Initial Impression / Assessment and Plan / ED Course  I have reviewed the triage vital signs and the nursing notes.  Pertinent labs & imaging results that were available during my care of the patient were reviewed by me and considered in my medical decision making (see chart for details).     X-rays are unremarkable.  The patient's diffuse joint pain is likely arthritis. She has no focal joint effusions or erythema. Highly doubt any septic joint. Discussed using NSAIDs and will have her follow-up with a PCP. She has seen the [facility] in the past and I recommend she go back there. Return precautions.  Final Clinical Impressions(s) / ED Diagnoses   Final diagnoses:  Right anterior knee pain  Pain of right heel    New Prescriptions Discharge Medication List as of 02/12/2017  4:02 PM       Pricilla Loveless, MD 02/12/17 1645

## 2017-02-12 NOTE — ED Notes (Signed)
Pt given d/c instructions as per chart. Rx x 1. Verbalizes understanding. No questions. 

## 2017-02-12 NOTE — ED Triage Notes (Addendum)
Patient states that she has pain to her right knee and her right heel x 2 months " but I didn't want to get it checked out while I was in jail". PAtient states that she has a hx of pain to her right heel after her surgery. She reports that now that she is clenaing houses the pain is getting worse in her knee, as this nurse if triaging the patient she reports that she also has pain in her arms and in other places

## 2018-04-19 IMAGING — CR DG KNEE COMPLETE 4+V*R*
4 series · 4 of 4 positions shown · non-contrast
Comparison: None.

CLINICAL DATA: Knee pain, no trauma

EXAM:
RIGHT KNEE - COMPLETE 4+ VIEW

[t knee ap right]
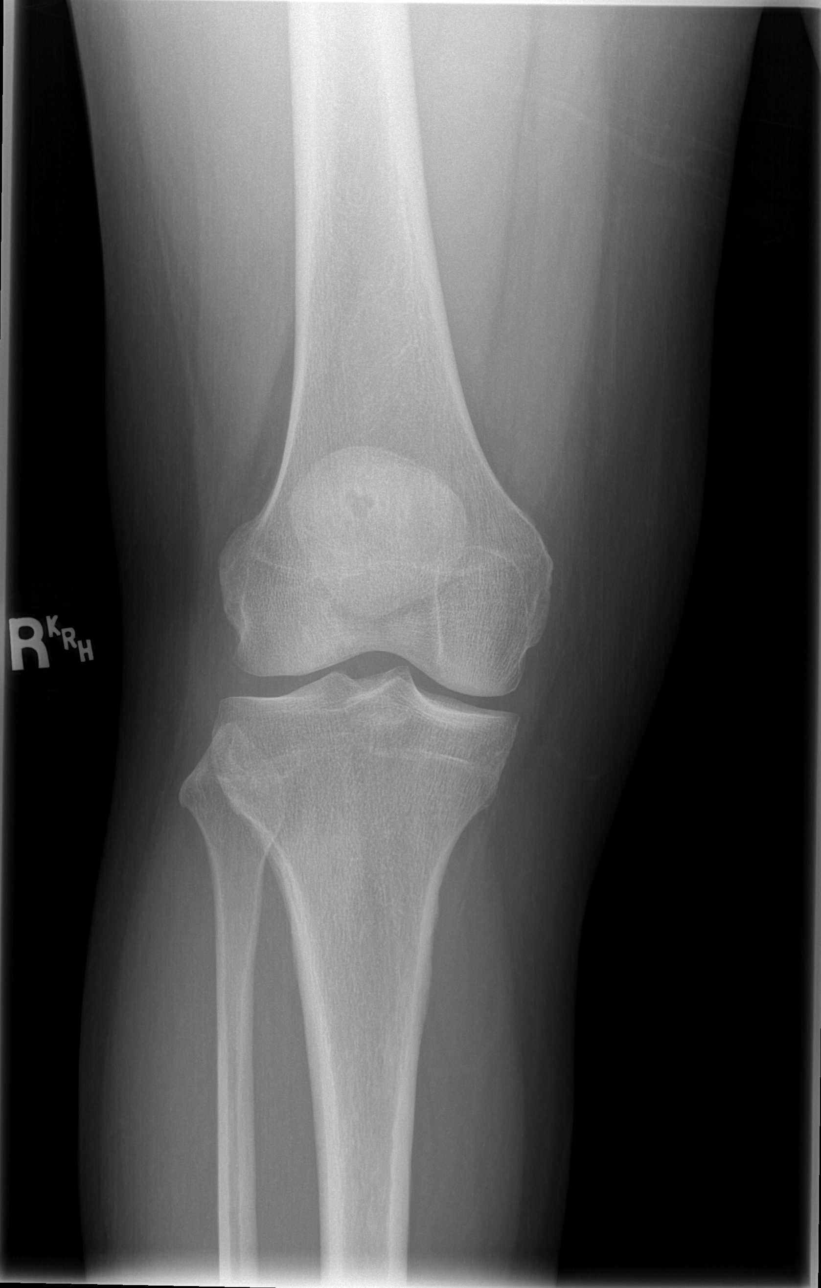

[t knee oblique right (1 of 2)]
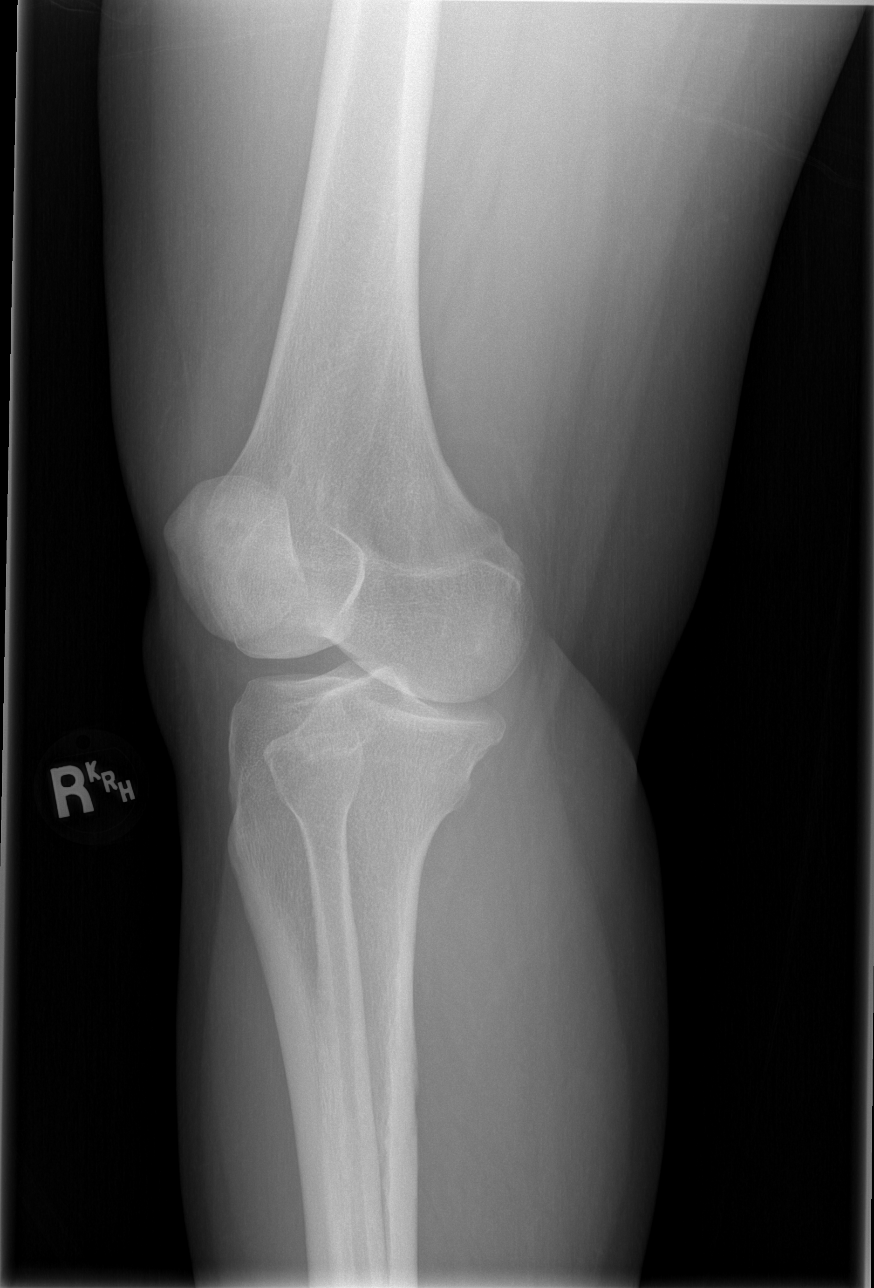

[t knee oblique right (2 of 2)]
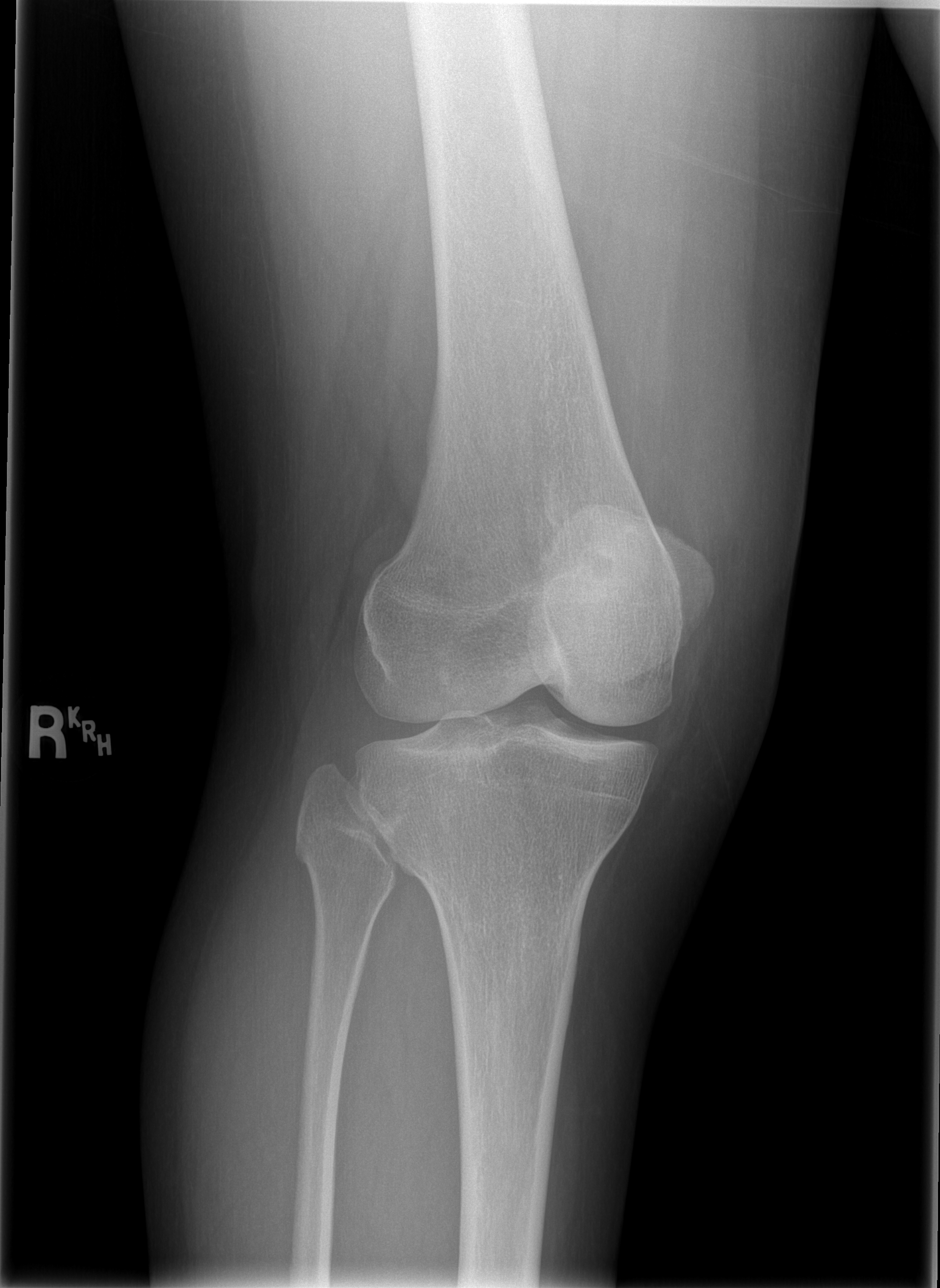

[t knee lat right]
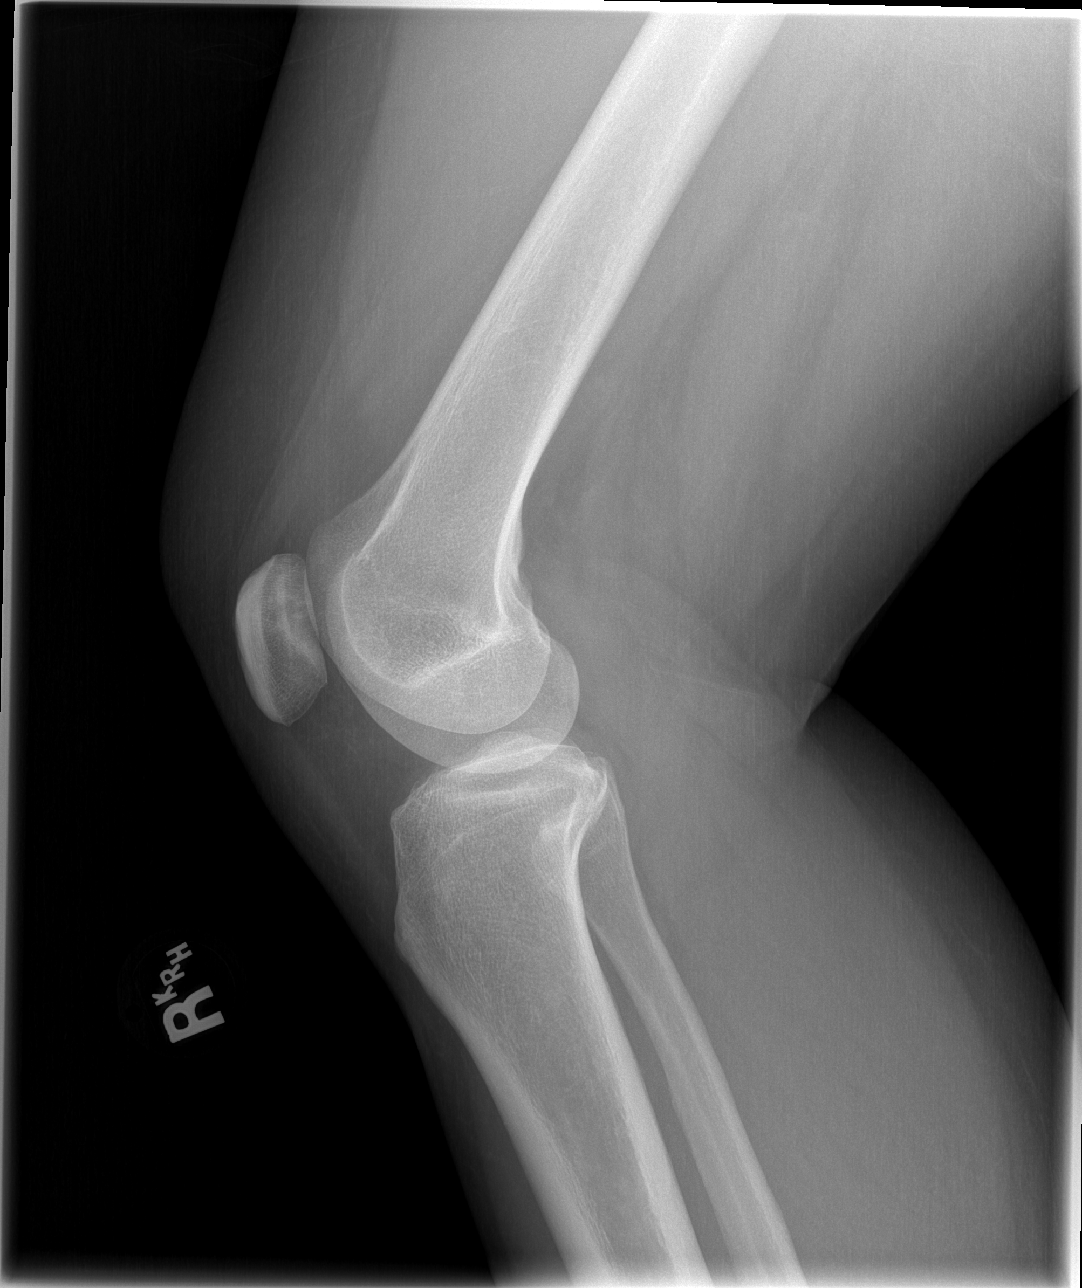

[4 of 4 positions shown; findings below may reference images not displayed]

FINDINGS: No fracture or dislocation is seen.

The joint spaces are preserved.

Visualized soft tissues are within normal limits.

No suprapatellar knee joint effusion.
IMPRESSION: Negative.

## 2020-05-27 ENCOUNTER — Encounter (HOSPITAL_BASED_OUTPATIENT_CLINIC_OR_DEPARTMENT_OTHER): Payer: Self-pay | Admitting: *Deleted

## 2020-05-27 ENCOUNTER — Other Ambulatory Visit: Payer: Self-pay

## 2020-05-27 DIAGNOSIS — M545 Low back pain, unspecified: Secondary | ICD-10-CM | POA: Insufficient documentation

## 2020-05-27 DIAGNOSIS — Z5321 Procedure and treatment not carried out due to patient leaving prior to being seen by health care provider: Secondary | ICD-10-CM | POA: Insufficient documentation

## 2020-05-27 DIAGNOSIS — R439 Unspecified disturbances of smell and taste: Secondary | ICD-10-CM | POA: Insufficient documentation

## 2020-05-27 DIAGNOSIS — R200 Anesthesia of skin: Secondary | ICD-10-CM | POA: Diagnosis not present

## 2020-05-27 DIAGNOSIS — R059 Cough, unspecified: Secondary | ICD-10-CM | POA: Diagnosis not present

## 2020-05-27 DIAGNOSIS — M79604 Pain in right leg: Secondary | ICD-10-CM | POA: Diagnosis not present

## 2020-05-27 DIAGNOSIS — J00 Acute nasopharyngitis [common cold]: Secondary | ICD-10-CM | POA: Diagnosis not present

## 2020-05-27 DIAGNOSIS — M79605 Pain in left leg: Secondary | ICD-10-CM | POA: Diagnosis not present

## 2020-05-27 NOTE — ED Triage Notes (Addendum)
Lower back pain x 3 days. Pain goes down both legs causing numbness. No injury. Cough and cold symptoms for over a week. Unknown Covid exposure. She had loss of taste last week.

## 2020-05-28 ENCOUNTER — Emergency Department (HOSPITAL_BASED_OUTPATIENT_CLINIC_OR_DEPARTMENT_OTHER)
Admission: EM | Admit: 2020-05-28 | Discharge: 2020-05-28 | Disposition: A | Discharge status: Home / Self Care | Payer: Medicare Other | Attending: Emergency Medicine | Admitting: Emergency Medicine

## 2021-09-18 ENCOUNTER — Telehealth: Payer: Medicare HMO | Admitting: Family Medicine

## 2021-09-18 DIAGNOSIS — K047 Periapical abscess without sinus: Secondary | ICD-10-CM | POA: Diagnosis not present

## 2021-09-18 MED ORDER — PENICILLIN V POTASSIUM 500 MG PO TABS: 500.0000 mg | ORAL_TABLET | Freq: Three times a day (TID) | ORAL | 0 refills | Status: AC

## 2021-09-18 NOTE — Patient Instructions (Signed)
Dental Abscess  A dental abscess is an infection around a tooth that may involve pain, swelling, and a collection of pus, as well as other symptoms. Treatment is important to help with symptoms and to prevent the infection from spreading. The general types of dental abscesses are: Pulpal abscess. This abscess may form from the inner part of the tooth (pulp). Periodontal abscess. This abscess may form from the gum. What are the causes? This condition is caused by a bacterial infection in or around the tooth. It may result from: Severe tooth decay (cavities). Trauma to the tooth, such as a broken or chipped tooth. What increases the risk? This condition is more likely to develop in males. It is also more likely to develop in people who: Have cavities. Have severe gum disease. Eat sugary snacks between meals. Use tobacco products. Have diabetes. Have a weakened disease-fighting system (immune system). Do not brush and care for their teeth regularly. What are the signs or symptoms? Mild symptoms of this condition include: Tenderness. Bad breath. Fever. A bitter taste in the mouth. Pain in and around the infected tooth. Moderate symptoms of this condition include: Swollen neck glands. Chills. Pus drainage. Swelling and redness around the infected tooth, in the mouth, or in the face. Severe pain in and around the infected tooth. Severe symptoms of this condition include: Difficulty swallowing. Difficulty opening the mouth. Nausea. Vomiting. How is this diagnosed? This condition is diagnosed based on: Your symptoms and your medical and dental history. An examination of the infected tooth. During the exam, your dental care provider may tap on the infected tooth. You may also need to have X-rays taken of the affected area. How is this treated? This condition is treated by getting rid of the infection. This may be done with: Antibiotic medicines. These may be used in certain  situations. Antibacterial mouth rinse. Incision and drainage. This procedure is done by making an incision in the abscess to drain out the pus. Removing pus is the first priority in treating an abscess. A root canal. This may be performed to save the tooth. Your dental care provider accesses the visible part of your tooth (crown) with a drill and removes any infected pulp. Then the space is filled and sealed off. Tooth extraction. The tooth is pulled out if it cannot be saved by other treatment. You may also receive treatment for pain, such as: Acetaminophen or NSAIDs. Gels that contain a numbing medicine. An injection to block the pain near your nerve. Follow these instructions at home: Medicines Take over-the-counter and prescription medicines only as told by your dental care provider. If you were prescribed an antibiotic, take it as told by your dental care provider. Do not stop taking the antibiotic even if you start to feel better. If you were prescribed a gel that contains a numbing medicine, use it exactly as told in the directions. Do not use these gels for children who are younger than 2 years of age. Use an antibacterial mouth rinse as told by your dental care provider. General instructions  Gargle with a mixture of salt and water 3-4 times a day or as needed. To make salt water, completely dissolve -1 tsp (3-6 g) of salt in 1 cup (237 mL) of warm water. Eat a soft diet while your abscess is healing. Drink enough fluid to keep your urine pale yellow. Do not apply heat to the outside of your mouth. Do not use any products that contain nicotine or tobacco. These   products include cigarettes, chewing tobacco, and vaping devices, such as e-cigarettes. If you need help quitting, ask your dental care provider. Keep all follow-up visits. This is important. How is this prevented?  Excellent dental home care, which includes brushing your teeth every morning and night with fluoride  toothpaste. Floss one time each day. Get regularly scheduled dental cleanings. Consider having a dental sealant applied on teeth that have deep grooves to prevent cavities. Drink fluoridated water regularly. This includes most tap water. Check the label on bottled water to see if it contains fluoride. Reduce or eliminate sugary drinks. Eat healthy meals and snacks. Wear a mouth guard or face shield to protect your teeth while playing sports. Contact a health care provider if: Your pain is worse and is not helped by medicine. You have swelling. You see pus around the tooth. You have a fever or chills. Get help right away if: Your symptoms suddenly get worse. You have a very bad headache. You have problems breathing or swallowing. You have trouble opening your mouth. You have swelling in your neck or around your eye. These symptoms may represent a serious problem that is an emergency. Do not wait to see if the symptoms will go away. Get medical help right away. Call your local emergency services (911 in the [place] Do not drive yourself to the hospital. Summary A dental abscess is a collection of pus in or around a tooth that results from an infection. A dental abscess may result from severe tooth decay, trauma to the tooth, or severe gum disease around a tooth. Symptoms include severe pain, swelling, redness, and drainage of pus in and around the infected tooth. The first priority in treating a dental abscess is to drain out the pus. Treatment may also involve removing damage inside the tooth (root canal) or extracting the tooth. This information is not intended to replace advice given to you by your health care provider. Make sure you discuss any questions you have with your health care provider. Document Revised: 06/26/2020 Document Reviewed: 06/26/2020 Elsevier Patient Education  2023 [employer] Inc[employer]  

## 2021-09-18 NOTE — Progress Notes (Signed)
Virtual Visit Consent   Crystal Holmes, you are scheduled for a virtual visit with a [facility] provider today. Just as with appointments in the office, your consent must be obtained to participate. Your consent will be active for this visit and any virtual visit you may have with one of our providers in the next 365 days. If you have a MyChart account, a copy of this consent can be sent to you electronically.  As this is a virtual visit, video technology does not allow for your provider to perform a traditional examination. This may limit your provider's ability to fully assess your condition. If your provider identifies any concerns that need to be evaluated in person or the need to arrange testing (such as labs, EKG, etc.), we will make arrangements to do so. Although advances in technology are sophisticated, we cannot ensure that it will always work on either your end or our end. If the connection with a video visit is poor, the visit may have to be switched to a telephone visit. With either a video or telephone visit, we are not always able to ensure that we have a secure connection.  By engaging in this virtual visit, you consent to the provision of healthcare and authorize for your insurance to be billed (if applicable) for the services provided during this visit. Depending on your insurance coverage, you may receive a charge related to this service.  I need to obtain your verbal consent now. Are you willing to proceed with your visit today? Crystal Holmes has provided verbal consent on 09/18/2021 for a virtual visit (video or telephone). Georgana Curio, FNP  Date: 09/18/2021 5:07 PM  Virtual Visit via Video Note   I, Georgana Curio, connected with  Crystal Holmes  ([id number], November 14, 1970) on 09/18/21 at  5:00 PM EDT by a video-enabled telemedicine application and verified that I am speaking with the correct person using two identifiers.  Location: Patient: Virtual Visit Location Patient:  Home Provider: Virtual Visit Location Provider: Home Office   I discussed the limitations of evaluation and management by telemedicine and the availability of in person appointments. The patient expressed understanding and agreed to proceed.    History of Present Illness: Crystal Holmes is a 51 y.o. who identifies as a female who was assigned female at birth, and is being seen today for a broken tooth on the left upper side with slight swelling and pain. No fever. She feels like it has gotten infected.   HPI: HPI  Problems: There are no problems to display for this patient.   Allergies: Not on File Medications: No current outpatient medications on file.  Observations/Objective: Patient is well-developed, well-nourished in no acute distress.  Resting comfortably  at home.  Head is normocephalic, atraumatic.  No labored breathing.  Speech is clear and coherent with logical content.  Patient is alert and oriented at baseline.    Assessment and Plan: 1. Dental abscess  Warm salt water rinses, ibuprofen as directed, med use and side effects discussed, follow up with a dentist. ED or urgent care if sx worsen.   Follow Up Instructions: I discussed the assessment and treatment plan with the patient. The patient was provided an opportunity to ask questions and all were answered. The patient agreed with the plan and demonstrated an understanding of the instructions.  A copy of instructions were sent to the patient via MyChart unless otherwise noted below.     The patient was advised to call back or seek an  in-person evaluation if the symptoms worsen or if the condition fails to improve as anticipated.  Time:  I spent 10 minutes with the patient via telehealth technology discussing the above problems/concerns.    Georgana Curio, FNP

## 2021-09-21 ENCOUNTER — Encounter (HOSPITAL_BASED_OUTPATIENT_CLINIC_OR_DEPARTMENT_OTHER): Payer: Self-pay | Admitting: *Deleted

## 2023-02-01 ENCOUNTER — Encounter (HOSPITAL_BASED_OUTPATIENT_CLINIC_OR_DEPARTMENT_OTHER): Payer: Self-pay

## 2023-02-01 ENCOUNTER — Other Ambulatory Visit: Payer: Self-pay

## 2023-02-01 DIAGNOSIS — R200 Anesthesia of skin: Secondary | ICD-10-CM | POA: Insufficient documentation

## 2023-02-01 DIAGNOSIS — R202 Paresthesia of skin: Secondary | ICD-10-CM | POA: Diagnosis not present

## 2023-02-01 DIAGNOSIS — M79602 Pain in left arm: Secondary | ICD-10-CM | POA: Diagnosis not present

## 2023-02-01 DIAGNOSIS — M542 Cervicalgia: Secondary | ICD-10-CM | POA: Insufficient documentation

## 2023-02-01 DIAGNOSIS — Z5321 Procedure and treatment not carried out due to patient leaving prior to being seen by health care provider: Secondary | ICD-10-CM | POA: Insufficient documentation

## 2023-02-01 NOTE — ED Triage Notes (Addendum)
Pt states she has DDD, pain started 2 days ago.  Pain radiates down left arm.  She can barely move left arm Pain 10/10 +numbness and tingling States the same thing happened on rt. Side and had to have sx States her pain was so bad she bought some Fentanyl off the streets to ease her pain "Snorted it, uses 2x/weekly Can not get her MD to prescribe Neurontin and flexeril

## 2023-02-02 ENCOUNTER — Emergency Department (HOSPITAL_BASED_OUTPATIENT_CLINIC_OR_DEPARTMENT_OTHER)
Admission: EM | Admit: 2023-02-02 | Discharge: 2023-02-02 | Discharge status: Against Medical Advice | Payer: Medicare HMO | Attending: Emergency Medicine | Admitting: Emergency Medicine

## 2023-05-11 ENCOUNTER — Emergency Department (HOSPITAL_BASED_OUTPATIENT_CLINIC_OR_DEPARTMENT_OTHER)
Admission: EM | Admit: 2023-05-11 | Discharge: 2023-05-11 | Disposition: A | Discharge status: Home / Self Care | Payer: Medicare HMO | Attending: Emergency Medicine | Admitting: Emergency Medicine

## 2023-05-11 ENCOUNTER — Encounter (HOSPITAL_BASED_OUTPATIENT_CLINIC_OR_DEPARTMENT_OTHER): Payer: Self-pay

## 2023-05-11 ENCOUNTER — Other Ambulatory Visit: Payer: Self-pay

## 2023-05-11 DIAGNOSIS — N3000 Acute cystitis without hematuria: Secondary | ICD-10-CM | POA: Diagnosis not present

## 2023-05-11 DIAGNOSIS — R300 Dysuria: Secondary | ICD-10-CM | POA: Diagnosis present

## 2023-05-11 DIAGNOSIS — D72829 Elevated white blood cell count, unspecified: Secondary | ICD-10-CM | POA: Diagnosis not present

## 2023-05-11 LAB — WET PREP, GENITAL
Clue Cells Wet Prep HPF POC: NONE SEEN
Sperm: NONE SEEN
Trich, Wet Prep: NONE SEEN
WBC, Wet Prep HPF POC: <10 (ref <10)
Yeast Wet Prep HPF POC: NONE SEEN

## 2023-05-11 LAB — URINALYSIS, ROUTINE W REFLEX MICROSCOPIC
Bilirubin Urine: NEGATIVE
Glucose, UA: NEGATIVE mg/dL
Ketones, ur: NEGATIVE mg/dL
Nitrite: POSITIVE — AB
Protein, ur: 30 mg/dL — AB
Specific Gravity, Urine: 1.025 (ref 1.005–1.030)
pH: 5.5 (ref 5.0–8.0)

## 2023-05-11 LAB — URINALYSIS, MICROSCOPIC (REFLEX): WBC, UA: >50 WBC/hpf (ref 0–5)

## 2023-05-11 MED ORDER — DOXYCYCLINE HYCLATE 100 MG PO CAPS: 100.0000 mg | ORAL_CAPSULE | Freq: Two times a day (BID) | ORAL | 0 refills | Status: AC

## 2023-05-11 MED ORDER — LIDOCAINE HCL (PF) 1 % IJ SOLN
1.0000 mL | Freq: Once | INTRAMUSCULAR | Status: AC
  Administered 2023-05-11: 1 mL
  Filled 2023-05-11: qty 5

## 2023-05-11 MED ORDER — CEFTRIAXONE SODIUM 500 MG IJ SOLR
500.0000 mg | Freq: Once | INTRAMUSCULAR | Status: AC
  Administered 2023-05-11: 500 mg via INTRAMUSCULAR
  Filled 2023-05-11: qty 500

## 2023-05-11 MED ORDER — CEPHALEXIN 500 MG PO CAPS: 500.0000 mg | ORAL_CAPSULE | Freq: Two times a day (BID) | ORAL | 0 refills | Status: AC

## 2023-05-11 MED ORDER — DOXYCYCLINE HYCLATE 100 MG PO TABS
100.0000 mg | ORAL_TABLET | Freq: Once | ORAL | Status: AC
  Administered 2023-05-11: 100 mg via ORAL
  Filled 2023-05-11: qty 1

## 2023-05-11 NOTE — ED Provider Notes (Signed)
 [place] [facility] Provider Note   CSN: [id number] Arrival date & time: 05/11/23  2104     History  Chief Complaint  Patient presents with   Dysuria    Crystal Holmes is a 53 y.o. female no pertinent past medical history presented for dysuria along with clear vaginal discharge with malodorous urine that began a few days ago.  Patient denies fevers abdominal pain nausea vomiting.  Patient states she went to be tested for STDs as her partner has been unfaithful.  Patient would like to be tested for gonorrhea chlamydia syphilis and HIV and would like to be prophylactically treated.  Home Medications Prior to Admission medications   Medication Sig Start Date End Date Taking? Authorizing Provider  cephALEXin  (KEFLEX ) 500 MG capsule Take 1 capsule (500 mg total) by mouth 2 (two) times daily for 5 days. 05/11/23 05/16/23 Yes Jackie Littlejohn, Lynwood DASEN, PA-C  doxycycline  (VIBRAMYCIN ) 100 MG capsule Take 1 capsule (100 mg total) by mouth 2 (two) times daily for 7 days. 05/11/23 05/18/23 Yes Taiz Bickle, Lynwood DASEN, PA-C  cyclobenzaprine  (FLEXERIL ) 10 MG tablet Take 1 tablet (10 mg total) by mouth 2 (two) times daily as needed for muscle spasms. 04/27/12   Powers, Halina DASEN, MD  cyclobenzaprine  (FLEXERIL ) 10 MG tablet Take 1 tablet (10 mg total) by mouth 3 (three) times daily as needed for muscle spasms. 05/28/12   Paz, Lisette, PA-C  HYDROcodone -acetaminophen  (NORCO/VICODIN) 5-325 MG per tablet Take 1 tablet by mouth every 6 (six) hours as needed for pain. 05/28/12   Paz, Lisette, PA-C  ibuprofen  (ADVIL ,MOTRIN ) 800 MG tablet Take 1 tablet (800 mg total) by mouth 3 (three) times daily. 04/27/12   Powers, Halina DASEN, MD  ibuprofen  (ADVIL ,MOTRIN ) 800 MG tablet Take 1 tablet (800 mg total) by mouth 3 (three) times daily. 02/16/13   Lenor Hollering, MD  naproxen  (NAPROSYN ) 500 MG tablet Take 1 tablet (500 mg total) by mouth 2 (two) times daily with a meal. 02/12/17   Freddi Hamilton,  MD  oxyCODONE -acetaminophen  (PERCOCET) 5-325 MG per tablet Take 1 tablet by mouth every 6 (six) hours as needed for pain. 04/27/12   Powers, Halina DASEN, MD  oxyCODONE -acetaminophen  (PERCOCET) 5-325 MG per tablet Take 2 tablets by mouth every 4 (four) hours as needed for pain. 02/16/13   Lenor Hollering, MD  oxyCODONE -acetaminophen  (PERCOCET) 5-325 MG per tablet Take 2 tablets by mouth every 4 (four) hours as needed. 04/04/13   Lenor Hollering, MD      Allergies    Patient has no known allergies.    Review of Systems   Review of Systems  Genitourinary:  Positive for dysuria.    Physical Exam Updated Vital Signs BP 122/80   Pulse 95   Temp 98.3 F (36.8 C)   Resp 18   Wt 83.9 kg   SpO2 99%   BMI 30.79 kg/m  Physical Exam Constitutional:      General: She is not in acute distress. Cardiovascular:     Rate and Rhythm: Normal rate and regular rhythm.     Pulses: Normal pulses.     Heart sounds: Normal heart sounds.     Comments: EMR states patient was tachycardic however when I evaluated her she was not tachycardic Pulmonary:     Effort: Pulmonary effort is normal. No respiratory distress.     Breath sounds: Normal breath sounds.  Abdominal:     Palpations: Abdomen is soft.     Tenderness: There is no  abdominal tenderness. There is no guarding or rebound.  Neurological:     Mental Status: She is alert.     ED Results / Procedures / Treatments   Labs (all labs ordered are listed, but only abnormal results are displayed) Labs Reviewed  URINALYSIS, ROUTINE W REFLEX MICROSCOPIC - Abnormal; Notable for the following components:      Result Value   APPearance CLOUDY (*)    Hgb urine dipstick SMALL (*)    Protein, ur 30 (*)    Nitrite POSITIVE (*)    Leukocytes,Ua MODERATE (*)    All other components within normal limits  URINALYSIS, MICROSCOPIC (REFLEX) - Abnormal; Notable for the following components:   Bacteria, UA MANY (*)    All other components within normal limits   WET PREP, GENITAL  URINE CULTURE  RPR  HIV ANTIBODY (ROUTINE TESTING W REFLEX)  GC/CHLAMYDIA PROBE AMP ([place]) NOT AT [facility]    EKG None  Radiology No results found.  Procedures Procedures    Medications Ordered in ED Medications  cefTRIAXone  (ROCEPHIN ) injection 500 mg (500 mg Intramuscular Given 05/11/23 2255)  lidocaine  (PF) (XYLOCAINE ) 1 % injection 1-2.1 mL (1 mL Other Given 05/11/23 2300)  doxycycline  (VIBRA -TABS) tablet 100 mg (100 mg Oral Given 05/11/23 2301)    ED Course/ Medical Decision Making/ A&P                                 Medical Decision Making Amount and/or Complexity of Data Reviewed Labs: ordered.  Risk Prescription drug management.   [name] 53 y.o. presented today for STD check. Working DDx that I considered at this time includes, but not limited to, GC/Chlamydia, Trichomonas, PID, BV, Yeast infection, UTI, HIV, Hep C, Syphilis, warts, chancroid.  R/o DDx: pending  Review of prior external notes: 03/26/2023 ED  Unique Tests and My Interpretation:  UA: Nitrite positive with leukocytes Wet Mount: Negative HIV: pending RPR: pending  Social Determinants of Health: none  Discussion with Independent Historian: None  Discussion of Management of Tests: None  Risk: Medium: prescription drug management  Risk Stratification Score: None  Plan: On exam patient was no acute distress stable vitals.  Patient unremarkable physical exam.  Patient will like to be prophylactically treated for gonorrhea chlamydia be tested for syphilis and HIV.  Urine from triage does show UTI and so we will also give few days with antibiotics for UTI.  Will also add on wet mount.  Wet mount negative.  Patient was prophylactically treated with rocephin  and doxycycline  for GC/Chlamydia. Patient encouraged to follow up on MyChart app for the rest of their results and to follow up with the health clinic or PCP.  Will also prescribe Keflex  for her UTI  as well.  Patient was given return precautions. Patient stable for discharge at this time.  Patient verbalized understanding of plan.  This chart was dictated using voice recognition software.  Despite best efforts to proofread,  errors can occur which can change the documentation meaning.         Final Clinical Impression(s) / ED Diagnoses Final diagnoses:  Acute cystitis without hematuria    Rx / DC Orders ED Discharge Orders          Ordered    cephALEXin  (KEFLEX ) 500 MG capsule  2 times daily        05/11/23 2305    doxycycline  (VIBRAMYCIN ) 100 MG capsule  2 times  daily        05/11/23 2305              Victor Lynwood DASEN, PA-C 05/11/23 2322    Freddi Hamilton, MD 05/11/23 [phone]

## 2023-05-11 NOTE — Discharge Instructions (Addendum)
 Please follow-up on the MyChart app in regards to recent symptoms and ER visit.  Today you are prophylactically treated for gonorrhea chlamydia have you need to follow-up with the MyChart app when results.  If you test negative for gonorrhea you may stop the doxycycline .  You also tested positive for UTI and so I prescribed medications for this as well.  If symptoms change or worsen please return to the ER.

## 2023-05-11 NOTE — ED Notes (Signed)
 Reviewed D/C information with the patient, pt verbalized understanding. No additional concerns at this time.

## 2023-05-11 NOTE — ED Triage Notes (Signed)
 Pt arrives with c/o dysuria and vaginal discharge that started a few days ago. Pt reports malodorous urine. Pt concerned for STD's due to partner being with someone else.

## 2023-05-12 LAB — RPR: RPR Ser Ql: NONREACTIVE

## 2023-05-12 LAB — HIV ANTIBODY (ROUTINE TESTING W REFLEX): HIV Screen 4th Generation wRfx: NONREACTIVE

## 2023-05-13 LAB — URINE CULTURE

## 2023-05-13 LAB — GC/CHLAMYDIA PROBE AMP (~~LOC~~) NOT AT ARMC
Chlamydia: NEGATIVE
Comment: NEGATIVE
Comment: NORMAL
Neisseria Gonorrhea: NEGATIVE
